# Patient Record
Sex: Female | Born: 1990 | Race: White | Hispanic: No | Smoking: Current every day smoker
Health system: Southern US, Community
[De-identification: ages and names within clinical notes are randomized; demographics above are authoritative.]

---

## 2015-05-26 ENCOUNTER — Emergency Department (HOSPITAL_BASED_OUTPATIENT_CLINIC_OR_DEPARTMENT_OTHER)
Admission: EM | Admit: 2015-05-26 | Discharge: 2015-05-26 | Discharge status: Against Medical Advice | Payer: Self-pay | Attending: Emergency Medicine | Admitting: Emergency Medicine

## 2015-05-26 ENCOUNTER — Encounter (HOSPITAL_BASED_OUTPATIENT_CLINIC_OR_DEPARTMENT_OTHER): Payer: Self-pay | Admitting: Emergency Medicine

## 2015-05-26 DIAGNOSIS — Z202 Contact with and (suspected) exposure to infections with a predominantly sexual mode of transmission: Secondary | ICD-10-CM | POA: Insufficient documentation

## 2015-05-26 DIAGNOSIS — Z720 Tobacco use: Secondary | ICD-10-CM | POA: Insufficient documentation

## 2015-05-26 MED ORDER — AZITHROMYCIN 250 MG PO TABS
1000.0000 mg | ORAL_TABLET | Freq: Every day | ORAL | Status: DC
  Administered 2015-05-26: 1000 mg via ORAL
  Filled 2015-05-26: qty 4

## 2015-05-26 MED ORDER — CEFTRIAXONE SODIUM 250 MG IJ SOLR
250.0000 mg | INTRAMUSCULAR | Status: DC
  Administered 2015-05-26: 250 mg via INTRAMUSCULAR
  Filled 2015-05-26: qty 250

## 2015-05-26 NOTE — ED Notes (Signed)
Pelvic cart at bedside. 

## 2015-05-26 NOTE — ED Notes (Signed)
Pt mentions "not sure why she needs the pelvic exam, had one in prison, wants to talk to the doctor about it".

## 2015-05-26 NOTE — ED Notes (Signed)
Pt reports previous sexual partner told her he had an STD. Pt denies any symptoms.

## 2015-05-26 NOTE — ED Provider Notes (Signed)
CSN: [id number]     Arrival date & time 05/26/15  1929 History  By signing my name below, I, Budd Palmer, attest that this documentation has been prepared under the direction and in the presence of Raeford Razor, MD. Electronically Signed: Budd Palmer, ED Scribe. 05/26/2015. 7:47 PM.    Chief Complaint  Patient presents with  . Exposure to STD   The history is provided by the patient. No language interpreter was used.   HPI Comments: Theresa Steele is a 24 y.o. female who presents to the Emergency Department complaining of exposure to an STD. Pt states a former sexual partner (last intercourse 3 weeks ago) was diagnosed with gonorrhea. Pt denies abdominal pain, pelvic pain, vaginal discharge, and vaginal pain.  History reviewed. No pertinent past medical history. History reviewed. No pertinent past surgical history. No family history on file. Social History  Substance Use Topics  . Smoking status: Current Every Day Smoker  . Smokeless tobacco: None  . Alcohol Use: No   OB History    No data available     Review of Systems  Gastrointestinal: Negative for abdominal pain.  Genitourinary: Negative for vaginal discharge, vaginal pain and pelvic pain.  All other systems reviewed and are negative.  Allergies  Review of patient's allergies indicates no known allergies.  Home Medications   Prior to Admission medications   Not on File   BP 121/74 mmHg  Pulse 111  Temp(Src) 98.3 F (36.8 C)  Resp 18  Ht  (1.6 m)  Wt 148 lb (67.132 kg)  BMI 26.22 kg/m2  SpO2 100% Physical Exam  Constitutional: She is oriented to person, place, and time. She appears well-developed and well-nourished.  HENT:  Head: Normocephalic.  Eyes: EOM are normal.  Neck: Normal range of motion.  Pulmonary/Chest: Effort normal.  Abdominal: Soft. She exhibits no distension. There is no tenderness.  Musculoskeletal: Normal range of motion.  Neurological: She is alert and oriented to person, place,  and time.  Psychiatric: She has a normal mood and affect.  Nursing note and vitals reviewed.   ED Course  Procedures  DIAGNOSTIC STUDIES: Oxygen Saturation is 10% on RA, normal by my interpretation.    COORDINATION OF CARE: 7:46 PM - Discussed plans to order diagnostic studies and treat for gonorrhea and chlamydia. Will come back with female chaperone for pelvic exam. Pt advised of plan for treatment and pt agrees.  Labs Review Labs Reviewed - No data to display  Imaging Review No results found. I have personally reviewed and evaluated these images and lab results as part of my medical decision-making.   EKG Interpretation None      MDM   Final diagnoses:  Exposure to STD    Pt eloped from ED after initial evaluation, but before pelvic could be done. Was in NAD.  I personally preformed the services scribed in my presence. The recorded information has been reviewed is accurate. Raeford Razor, MD.   Raeford Razor, MD 05/30/15 [phone]

## 2015-05-26 NOTE — ED Notes (Signed)
Returned to draw blood, pt nor belongings in room, gown on bed, not in w/r or b/r, registration acknowledged "pt walked out", pt eloped.

## 2016-09-10 ENCOUNTER — Emergency Department (HOSPITAL_COMMUNITY)
Admission: EM | Admit: 2016-09-10 | Discharge: 2016-09-11 | Disposition: A | Discharge status: Home / Self Care | Payer: Medicaid Other | Attending: Emergency Medicine | Admitting: Emergency Medicine

## 2016-09-10 ENCOUNTER — Emergency Department (HOSPITAL_COMMUNITY): Payer: Medicaid Other

## 2016-09-10 ENCOUNTER — Encounter (HOSPITAL_COMMUNITY): Payer: Self-pay | Admitting: Emergency Medicine

## 2016-09-10 DIAGNOSIS — F17200 Nicotine dependence, unspecified, uncomplicated: Secondary | ICD-10-CM | POA: Diagnosis not present

## 2016-09-10 DIAGNOSIS — K6289 Other specified diseases of anus and rectum: Secondary | ICD-10-CM | POA: Insufficient documentation

## 2016-09-10 DIAGNOSIS — S7011XA Contusion of right thigh, initial encounter: Secondary | ICD-10-CM | POA: Insufficient documentation

## 2016-09-10 DIAGNOSIS — Y999 Unspecified external cause status: Secondary | ICD-10-CM | POA: Diagnosis not present

## 2016-09-10 DIAGNOSIS — Y929 Unspecified place or not applicable: Secondary | ICD-10-CM | POA: Insufficient documentation

## 2016-09-10 DIAGNOSIS — X58XXXA Exposure to other specified factors, initial encounter: Secondary | ICD-10-CM | POA: Diagnosis not present

## 2016-09-10 DIAGNOSIS — T148XXA Other injury of unspecified body region, initial encounter: Secondary | ICD-10-CM

## 2016-09-10 DIAGNOSIS — Y939 Activity, unspecified: Secondary | ICD-10-CM | POA: Diagnosis not present

## 2016-09-10 DIAGNOSIS — S79921A Unspecified injury of right thigh, initial encounter: Secondary | ICD-10-CM | POA: Diagnosis present

## 2016-09-10 LAB — CBC WITH DIFFERENTIAL/PLATELET
BASOS ABS: 0 10*3/uL (ref 0.0–0.1)
Basophils Relative: 0 %
EOS PCT: 0 %
Eosinophils Absolute: 0.1 10*3/uL (ref 0.0–0.7)
HEMATOCRIT: 25.9 % — AB (ref 36.0–46.0)
HEMOGLOBIN: 8.7 g/dL — AB (ref 12.0–15.0)
LYMPHS ABS: 2.4 10*3/uL (ref 0.7–4.0)
LYMPHS PCT: 21 %
MCH: 32.2 pg (ref 26.0–34.0)
MCHC: 33.6 g/dL (ref 30.0–36.0)
MCV: 95.9 fL (ref 78.0–100.0)
Monocytes Absolute: 0.6 10*3/uL (ref 0.1–1.0)
Monocytes Relative: 5 %
NEUTROS ABS: 8.5 10*3/uL — AB (ref 1.7–7.7)
Neutrophils Relative %: 74 %
Platelets: 388 10*3/uL (ref 150–400)
RBC: 2.7 MIL/uL — AB (ref 3.87–5.11)
RDW: 13 % (ref 11.5–15.5)
WBC: 11.5 10*3/uL — AB (ref 4.0–10.5)

## 2016-09-10 LAB — BASIC METABOLIC PANEL
ANION GAP: 8 (ref 5–15)
BUN: 7 mg/dL (ref 6–20)
CHLORIDE: 107 mmol/L (ref 101–111)
CO2: 24 mmol/L (ref 22–32)
Calcium: 8.3 mg/dL — ABNORMAL LOW (ref 8.9–10.3)
Creatinine, Ser: 0.5 mg/dL (ref 0.44–1.00)
GFR calc Af Amer: >60 mL/min (ref >60)
GLUCOSE: 95 mg/dL (ref 65–99)
POTASSIUM: 3.7 mmol/L (ref 3.5–5.1)
Sodium: 139 mmol/L (ref 135–145)

## 2016-09-10 MED ORDER — IOPAMIDOL (ISOVUE-300) INJECTION 61%
INTRAVENOUS | Status: AC
  Filled 2016-09-10: qty 100

## 2016-09-10 MED ORDER — IBUPROFEN 200 MG PO TABS
200.0000 mg | ORAL_TABLET | Freq: Once | ORAL | Status: AC
  Administered 2016-09-10: 200 mg via ORAL
  Filled 2016-09-10: qty 1

## 2016-09-10 MED ORDER — IBUPROFEN 400 MG PO TABS
400.0000 mg | ORAL_TABLET | Freq: Once | ORAL | Status: DC
  Filled 2016-09-10: qty 1

## 2016-09-10 NOTE — ED Provider Notes (Signed)
[facility]-EMERGENCY DEPT Provider Note   CSN: [id number] Arrival date & time: 09/10/16  1623  By signing my name below, I, Ryan Long, attest that this documentation has been prepared under the direction and in the presence of Lavera Guise, MD . Electronically Signed: Garen Lah, Scribe. 09/10/2016. 7:33 PM.  History   Chief Complaint Chief Complaint  Patient presents with  . Rectal Pain   HPI HPI Comments: Kristianne Albin is a 26 y.o. female with no pertinent PMHx, who presents to the Emergency Department complaining of gradually worsening rectal pain which began this morning. Pt reports that she gave birth nine days ago via vaginal delivery. She notes that she was experiencing similar pain to the area following giving birth. States that she had bruising and pain to perineum after given birth but this later resolved.  However, her pain returned this morning and has been worsening since. She has associated symptoms mild URI like symptoms that are gradually improving. She has taken percocet and ibuprofen at home with minimal relief. Her pain is exacerbated with ambulation. No recent trauma to the area. Pt is not currently on anticoagulant or antiplatelet therapy. No rectal bleeding or swelling. She denies any rectal bleeding, constipation, fever, nausea, dysuria, hematuria, and any other associated symptoms at this time.  The history is provided by the patient. No language interpreter was used.   History reviewed. No pertinent past medical history.  There are no active problems to display for this patient.  History reviewed. No pertinent surgical history.  OB History    No data available       Home Medications    Prior to Admission medications   Not on File    Family History No family history on file.  Social History Social History  Substance Use Topics  . Smoking status: Current Every Day Smoker  . Smokeless tobacco: Not on file  . Alcohol use No     Allergies   Toradol  [ketorolac tromethamine] and Tramadol   Review of Systems Review of Systems 10/14 systems reviewed and are negative other than those stated in the HPI   Physical Exam Updated Vital Signs BP 130/81 (BP Location: Right Arm)   Pulse 108   Temp 99.7 F (37.6 C) (Oral)   Resp 16   Ht 5' 3.5" (1.613 m)   Wt 183 lb (83 kg)   SpO2 100%   BMI 31.91 kg/m   Physical Exam Physical Exam  Nursing note and vitals reviewed. Constitutional: Well developed, well nourished, non-toxic, and in no acute distress Head: Normocephalic and atraumatic.  Mouth/Throat: Oropharynx is clear and moist.  Neck: Normal range of motion. Neck supple.  Cardiovascular: Normal rate and regular rhythm.   Pulmonary/Chest: Effort normal and breath sounds normal.  Abdominal: Soft. There is no tenderness. There is no rebound and no guarding.  Rectum: Soft tissue brusing noted to right buttock. Musculoskeletal: Normal range of motion.  Neurological: Alert, no facial droop, fluent speech, moves all extremities symmetrically Skin: Skin is warm and dry.  Psychiatric: Cooperative   ED Treatments / Results  DIAGNOSTIC STUDIES:  Oxygen Saturation is 100% on RA, normal by my interpretation.    COORDINATION OF CARE:  7:14 PM Discussed treatment plan with pt at bedside and pt agreed to plan.  Labs (all labs ordered are listed, but only abnormal results are displayed) Labs Reviewed  CBC WITH DIFFERENTIAL/PLATELET - Abnormal; Notable for the following:       Result Value   WBC 11.5 (*)  RBC 2.70 (*)    Hemoglobin 8.7 (*)    HCT 25.9 (*)    Neutro Abs 8.5 (*)    All other components within normal limits  BASIC METABOLIC PANEL - Abnormal; Notable for the following:    Calcium 8.3 (*)    All other components within normal limits    EKG  EKG Interpretation None       Radiology No results found.  Procedures Procedures (including critical care time)  Medications Ordered in ED Medications  ibuprofen  (ADVIL,MOTRIN) tablet 400 mg (not administered)  ibuprofen (ADVIL,MOTRIN) tablet 200 mg (200 mg Oral Given 09/10/16 2012)     Initial Impression / Assessment and Plan / ED Course  I have reviewed the triage vital signs and the nursing notes.  Pertinent labs & imaging results that were available during my care of the patient were reviewed by me and considered in my medical decision making (see chart for details).     26 year old female who presents with rectal/buttock pain after recent NSVD. Is non-toxic, well appearing. With bruising noted to the right buttock and perianally. Abdomen benign. State she had CT at [facility] for this, and was told that it was just clotting, but was recommended supportive care and pain control.   [facility]. Records reviewed. With history of HCV, polysubstance abuse. She was complaining of vaginal pain, and had CT showing vaginal wall hematoma 7 cm after vaginal delivery extending within the deep left pelvis to vagina/rectum. Hgb followed and stable. At day of discharge hgb 8.8.  Will perform CT pelvis to evaluate for enlarging hematoma. If stable or improved, may be discharged for OB follow-up.   Final Clinical Impressions(s) / ED Diagnoses   Final diagnoses:  Bruising    New Prescriptions New Prescriptions   No medications on file   I personally performed the services described in this documentation, which was scribed in my presence. The recorded information has been reviewed and is accurate.    Lavera Guiseana Duo Lennan Malone, MD 09/10/16 2226

## 2016-09-10 NOTE — ED Notes (Signed)
Patient transported to CT 

## 2016-09-10 NOTE — ED Notes (Signed)
IV access attempted X2 without success. 

## 2016-09-10 NOTE — ED Triage Notes (Signed)
Pt reports she had a baby on 1/16 and had a bloot clot on her buttocks, pt now reports severe pain and bruising to buttocks.

## 2016-09-11 MED ORDER — HYDROCODONE-ACETAMINOPHEN 5-325 MG PO TABS: 1.0000 | ORAL_TABLET | Freq: Four times a day (QID) | ORAL | 0 refills | Status: AC | PRN

## 2016-09-11 MED ORDER — DOCUSATE SODIUM 250 MG PO CAPS: 250.0000 mg | ORAL_CAPSULE | Freq: Every day | ORAL | 0 refills | Status: AC

## 2016-09-11 NOTE — ED Provider Notes (Signed)
CT results reviewed and shared with patient. No indication of expanding hematoma.  Patient will be discharged home per plan with follow-up by her OB/GYN.   Felicie Mornavid Almin Livingstone, NP 09/11/16 0011    Lavera Guiseana Duo Liu, MD 09/15/16 [phone]

## 2016-09-11 NOTE — Discharge Instructions (Signed)
Please follow up with your obstetrician as discussed

## 2018-09-24 IMAGING — CT CT PELVIS W/ CM
2 of 3 series · 16 of 46 positions shown, 18 images · IV contrast (agent unspecified)
Comparison: None.

CLINICAL DATA: Right pelvic hematoma after vaginal delivery 1 week
ago at [REDACTED]. New bruising and pain over left buttock
and perianal region.

EXAM:
CT PELVIS WITH CONTRAST
TECHNIQUE: Multidetector CT imaging of the pelvis was performed using the
standard protocol following the bolus administration of intravenous
contrast.
CONTRAST:  100 cc Omni 300 IV

[Series 2: pelvis 2.0 st · axial · 0.88mm/px · z∈[-865,-579]mm · 13 of 165 slices shown, 15 images]
[im 11/165  soft-tissue]
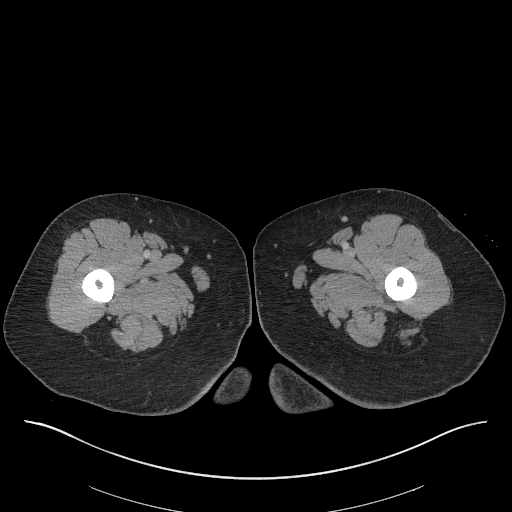
[im 11/165  bone]
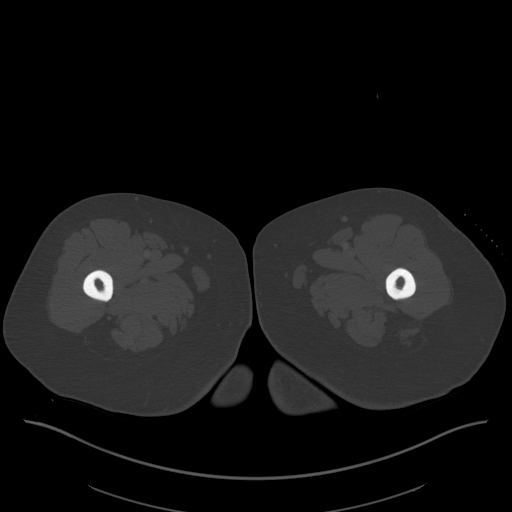
[im 22/165  soft-tissue]
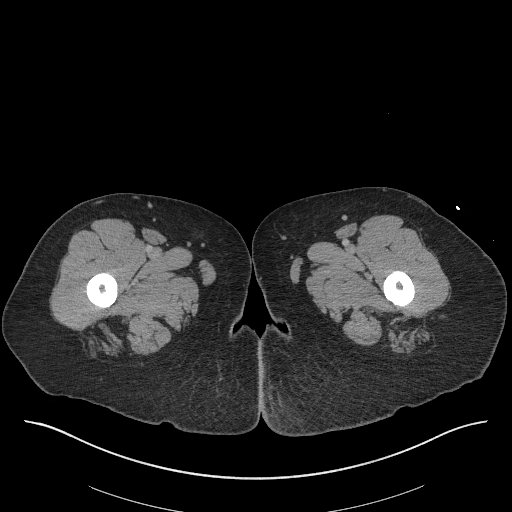
[im 32/165  soft-tissue]
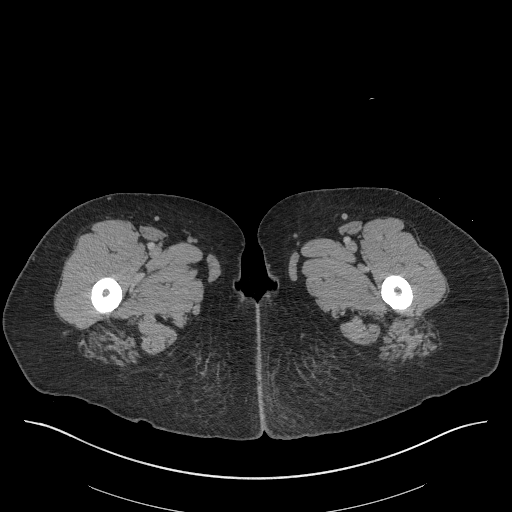
[im 48/165  soft-tissue]
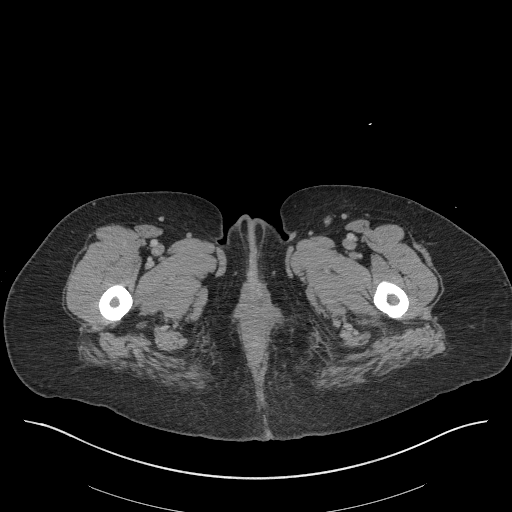
[im 59/165  soft-tissue]
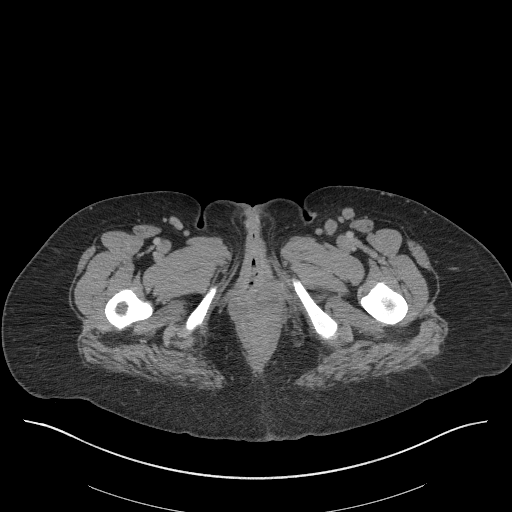
[im 69/165  soft-tissue]
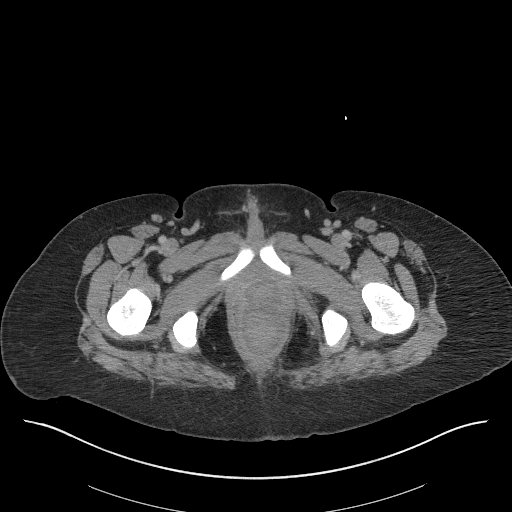
[im 85/165  soft-tissue]
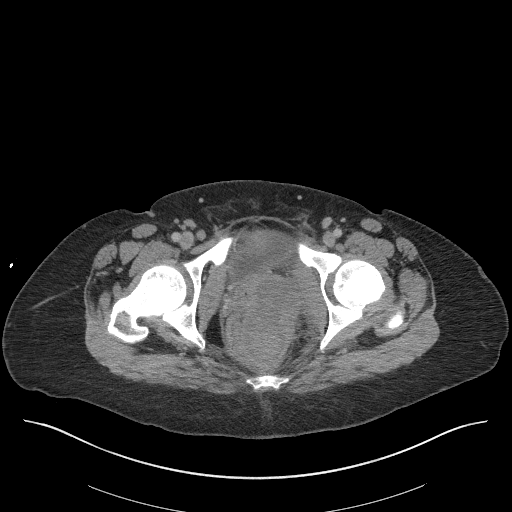
[im 96/165  soft-tissue]
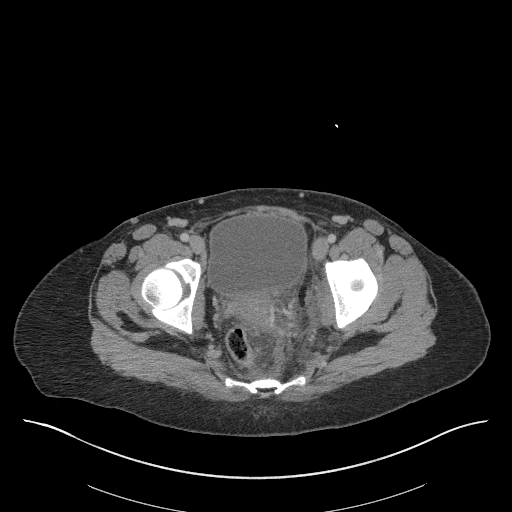
[im 106/165  soft-tissue]
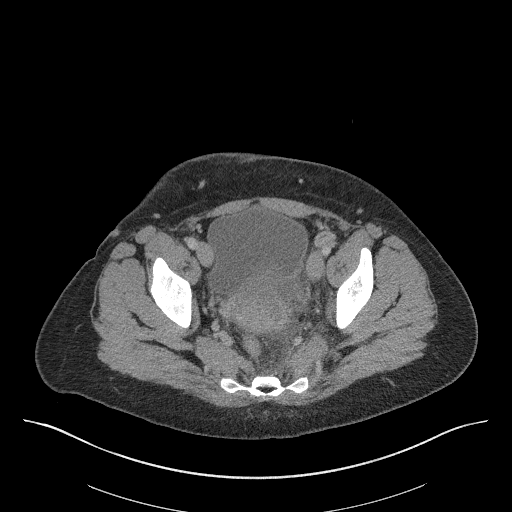
[im 106/165  bone]
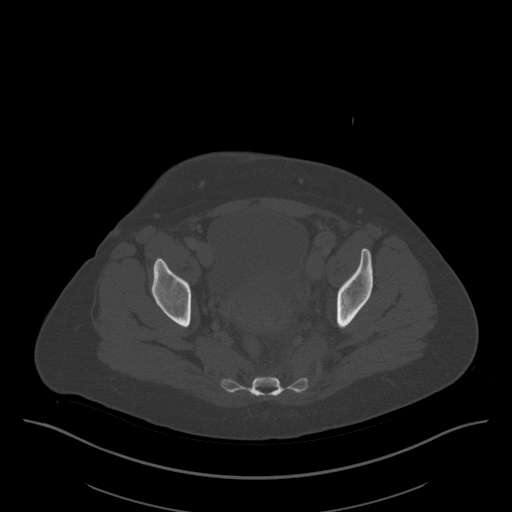
[im 117/165  soft-tissue]
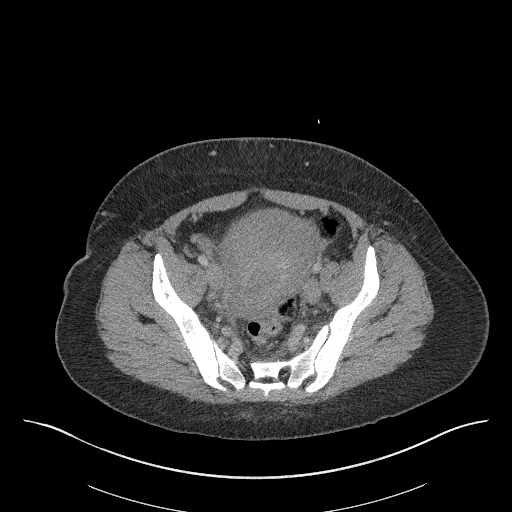
[im 133/165  soft-tissue]
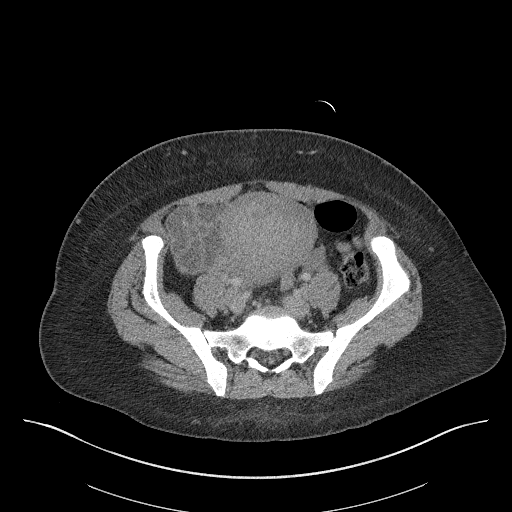
[im 143/165  soft-tissue]
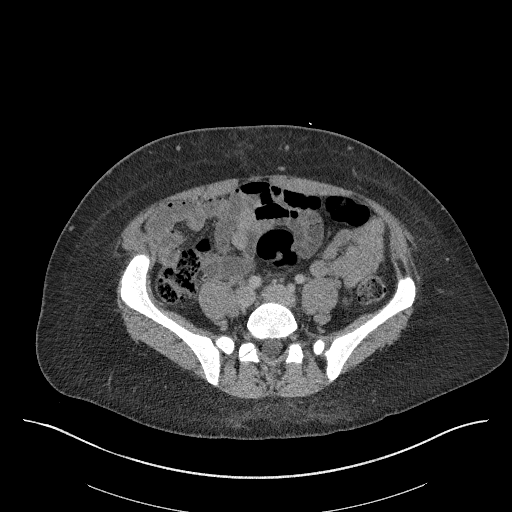
[im 154/165  soft-tissue]
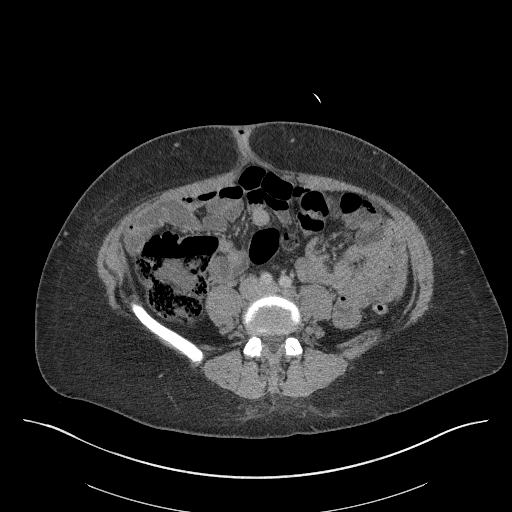

[Series 7: coronal st · coronal · 0.64mm/px · 3 of 140 slices shown]
[im 47/140  soft-tissue]
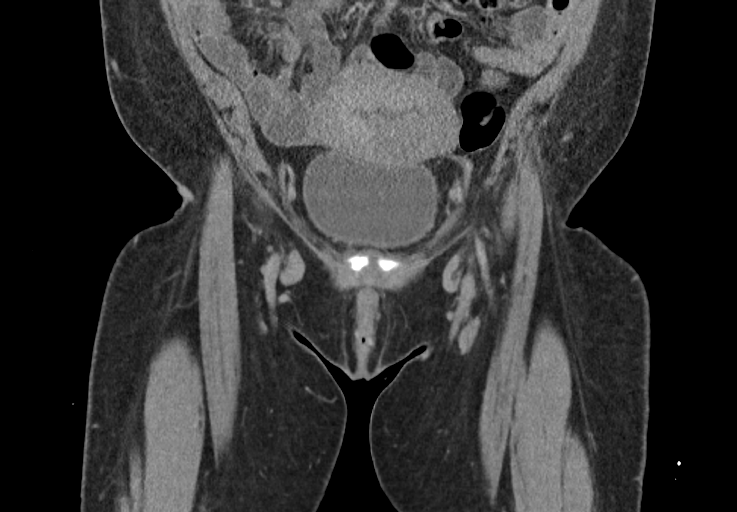
[im 62/140  soft-tissue]
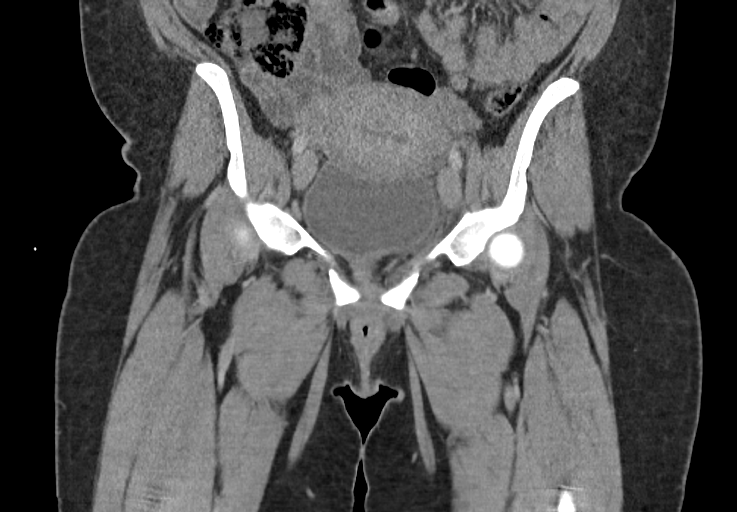
[im 78/140  soft-tissue]
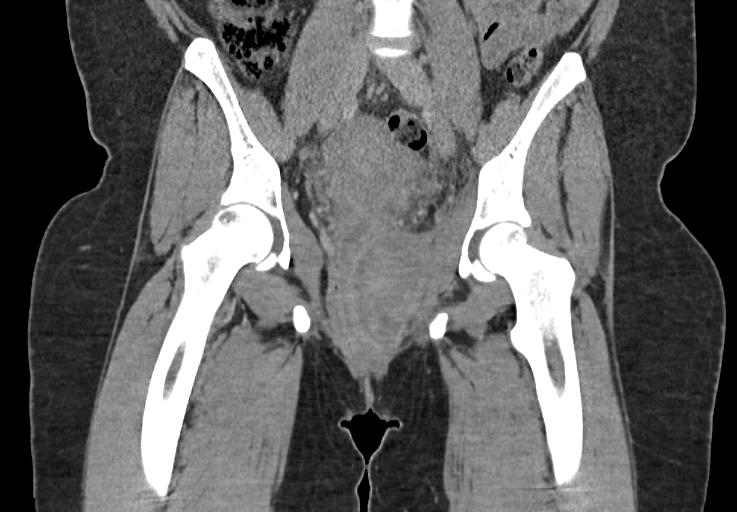

[16 of 46 positions shown; findings below may reference images not displayed]

FINDINGS: Urinary Tract:  Normal appearing bladder.

Bowel: No bowel obstruction or acute inflammation. There is a mixed
density fluid collection deviating the rectum and anal canal to the
right measuring 4.7 x 7.7 x 4.9 cm consistent with a left-sided
perineal hematoma.

Vascular/Lymphatic: No pathologically enlarged lymph nodes. No
significant vascular abnormality seen.

Reproductive: Prominence of the uterus consistent with postpartum
status.

Other:  None.

Musculoskeletal: No suspicious bone lesions identified.
IMPRESSION: Left perineal mixed density fluid collection measuring 4.7 x 7.7 x
4.9 cm causing mass effect on the adjacent rectum and anus
consistent with a hematoma.
